# Patient Record
Sex: Male | Born: 1960 | Race: White | Hispanic: No | Marital: Single | Smoking: Never smoker
Health system: Southern US, Community
[De-identification: ages and names within clinical notes are randomized; demographics above are authoritative.]

## PROBLEM LIST (undated history)

## (undated) DIAGNOSIS — F32A Depression, unspecified: Secondary | ICD-10-CM

## (undated) DIAGNOSIS — F329 Major depressive disorder, single episode, unspecified: Secondary | ICD-10-CM

## (undated) DIAGNOSIS — C801 Malignant (primary) neoplasm, unspecified: Secondary | ICD-10-CM

## (undated) HISTORY — PX: LUNG REMOVAL, PARTIAL: SHX233

## (undated) HISTORY — PX: OTHER SURGICAL HISTORY: SHX169

---

## 2015-06-23 ENCOUNTER — Emergency Department (HOSPITAL_BASED_OUTPATIENT_CLINIC_OR_DEPARTMENT_OTHER)
Admission: EM | Admit: 2015-06-23 | Discharge: 2015-06-23 | Disposition: A | Discharge status: Home / Self Care | Payer: Commercial Managed Care - HMO | Attending: Emergency Medicine | Admitting: Emergency Medicine

## 2015-06-23 ENCOUNTER — Emergency Department (HOSPITAL_BASED_OUTPATIENT_CLINIC_OR_DEPARTMENT_OTHER): Payer: Commercial Managed Care - HMO

## 2015-06-23 ENCOUNTER — Encounter (HOSPITAL_BASED_OUTPATIENT_CLINIC_OR_DEPARTMENT_OTHER): Payer: Self-pay

## 2015-06-23 DIAGNOSIS — M545 Low back pain, unspecified: Secondary | ICD-10-CM

## 2015-06-23 DIAGNOSIS — Z79899 Other long term (current) drug therapy: Secondary | ICD-10-CM | POA: Diagnosis not present

## 2015-06-23 DIAGNOSIS — F329 Major depressive disorder, single episode, unspecified: Secondary | ICD-10-CM | POA: Diagnosis not present

## 2015-06-23 DIAGNOSIS — L729 Follicular cyst of the skin and subcutaneous tissue, unspecified: Secondary | ICD-10-CM

## 2015-06-23 DIAGNOSIS — L089 Local infection of the skin and subcutaneous tissue, unspecified: Secondary | ICD-10-CM | POA: Insufficient documentation

## 2015-06-23 DIAGNOSIS — Z859 Personal history of malignant neoplasm, unspecified: Secondary | ICD-10-CM | POA: Insufficient documentation

## 2015-06-23 DIAGNOSIS — Z85118 Personal history of other malignant neoplasm of bronchus and lung: Secondary | ICD-10-CM | POA: Insufficient documentation

## 2015-06-23 HISTORY — DX: Depression, unspecified: F32.A

## 2015-06-23 HISTORY — DX: Malignant (primary) neoplasm, unspecified: C80.1

## 2015-06-23 HISTORY — DX: Major depressive disorder, single episode, unspecified: F32.9

## 2015-06-23 LAB — I-STAT CHEM 8, ED
BUN: 19 mg/dL (ref 6–20)
CALCIUM ION: 1.2 mmol/L (ref 1.12–1.23)
CHLORIDE: 101 mmol/L (ref 101–111)
Creatinine, Ser: 0.7 mg/dL (ref 0.61–1.24)
GLUCOSE: 113 mg/dL — AB (ref 65–99)
HCT: 41 % (ref 39.0–52.0)
HEMOGLOBIN: 13.9 g/dL (ref 13.0–17.0)
POTASSIUM: 4.2 mmol/L (ref 3.5–5.1)
SODIUM: 140 mmol/L (ref 135–145)
TCO2: 26 mmol/L (ref 0–100)

## 2015-06-23 MED ORDER — ONDANSETRON HCL 4 MG/2ML IJ SOLN
4.0000 mg | Freq: Once | INTRAMUSCULAR | Status: AC
  Administered 2015-06-23: 4 mg via INTRAVENOUS
  Filled 2015-06-23: qty 2

## 2015-06-23 MED ORDER — MORPHINE SULFATE 4 MG/ML IJ SOLN
4.0000 mg | Freq: Once | INTRAMUSCULAR | Status: AC
  Administered 2015-06-23: 4 mg via INTRAVENOUS
  Filled 2015-06-23: qty 1

## 2015-06-23 MED ORDER — CLINDAMYCIN HCL 150 MG PO CAPS: 450.0000 mg | ORAL_CAPSULE | Freq: Four times a day (QID) | ORAL | Status: AC

## 2015-06-23 MED ORDER — IOHEXOL 300 MG/ML  SOLN
100.0000 mL | Freq: Once | INTRAMUSCULAR | Status: AC | PRN
  Administered 2015-06-23: 100 mL via INTRAVENOUS

## 2015-06-23 MED ORDER — OXYCODONE HCL 5 MG PO TABS: 5.0000 mg | ORAL_TABLET | ORAL | Status: AC | PRN

## 2015-06-23 NOTE — Discharge Instructions (Signed)
Epidermal Cyst An epidermal cyst is sometimes called a sebaceous cyst, epidermal inclusion cyst, or infundibular cyst. These cysts usually contain a substance that looks "pasty" or "cheesy" and may have a bad smell. This substance is a protein called keratin. Epidermal cysts are usually found on the face, neck, or trunk. They may also occur in the vaginal area or other parts of the genitalia of both men and women. Epidermal cysts are usually small, painless, slow-growing bumps or lumps that move freely under the skin. It is important not to try to pop them. This may cause an infection and lead to tenderness and swelling. CAUSES  Epidermal cysts may be caused by a deep penetrating injury to the skin or a plugged hair follicle, often associated with acne. SYMPTOMS  Epidermal cysts can become inflamed and cause:  Redness.  Tenderness.  Increased temperature of the skin over the bumps or lumps.  Grayish-white, bad smelling material that drains from the bump or lump. DIAGNOSIS  Epidermal cysts are easily diagnosed by your caregiver during an exam. Rarely, a tissue sample (biopsy) may be taken to rule out other conditions that may resemble epidermal cysts. TREATMENT   Epidermal cysts often get better and disappear on their own. They are rarely ever cancerous.  If a cyst becomes infected, it may become inflamed and tender. This may require opening and draining the cyst. Treatment with antibiotics may be necessary. When the infection is gone, the cyst may be removed with minor surgery.  Small, inflamed cysts can often be treated with antibiotics or by injecting steroid medicines.  Sometimes, epidermal cysts become large and bothersome. If this happens, surgical removal in your caregiver's office may be necessary. HOME CARE INSTRUCTIONS  Only take over-the-counter or prescription medicines as directed by your caregiver.  Take your antibiotics as directed. Finish them even if you start to feel  better. SEEK MEDICAL CARE IF:   Your cyst becomes tender, red, or swollen.  Your condition is not improving or is getting worse.  You have any other questions or concerns. MAKE SURE YOU:  Understand these instructions.  Will watch your condition.  Will get help right away if you are not doing well or get worse. Document Released: 10/12/2004 Document Revised: 02/03/2012 Document Reviewed: 05/20/2011 ExitCare Patient Information 2015 [employer]. This information is not intended to replace advice given to you by your health care provider. Make sure you discuss any questions you have with your health care provider.  

## 2015-06-23 NOTE — ED Notes (Signed)
Pt reports L lower lumbar "nodule" that he has had checked before, states that last night he started having intense pain to site, new dx of cancer in neck, had some removed recently and subsequent infection at site.

## 2015-06-23 NOTE — ED Provider Notes (Signed)
CSN: [id number]     Arrival date & time 06/23/15  1130 History   First MD Initiated Contact with Patient 06/23/15 1235     Chief Complaint  Patient presents with  . Cyst     (Consider location/radiation/quality/duration/timing/severity/associated sxs/prior Treatment) Patient is a 54 y.o. male presenting with back pain. The history is provided by the patient.  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 Patient states he's had that therefore many years however is normally not painful. Patient had an ultrasound and was told with a simple cyst. Patient was recently rediagnosed with recurrence of his cancer. Patient is seen at [facility] for this. Took some home oxycodone with some relief. Denies fevers or chills denies nausea vomiting or diarrhea.   Past Medical History  Diagnosis Date  . Cancer   . Carcinoma   . Depression    Past Surgical History  Procedure Laterality Date  . Lung removal, partial    . Neck carcinoma removal     No family history on file. History  Substance Use Topics  . Smoking status: Never Smoker   . Smokeless tobacco: Not on file  . Alcohol Use: No    Review of Systems  Constitutional: Negative for fever and chills.  HENT: Negative for congestion and facial swelling.   Eyes: Negative for discharge and visual disturbance.  Respiratory: Negative for shortness of breath.   Cardiovascular: Negative for chest pain and palpitations.    Gastrointestinal: Negative for vomiting, abdominal pain and diarrhea.  Musculoskeletal: Positive for back pain. Negative for myalgias and arthralgias.  Skin: Negative for color change and rash.  Neurological: Negative for tremors, syncope and headaches.  Psychiatric/Behavioral: Negative for confusion and dysphoric mood.      Allergies  Review of patient's allergies indicates no known allergies.  Home Medications   Prior to Admission medications   Medication Sig Start Date End Date Taking? Authorizing Provider  ARIPiprazole (ABILIFY) 10 MG tablet Take 10 mg by mouth daily.   Yes Historical Provider, MD  Armodafinil (NUVIGIL PO) Take by mouth.   Yes Historical Provider, MD  escitalopram (LEXAPRO) 10 MG tablet Take 10 mg by mouth daily.   Yes Historical Provider, MD  lamoTRIgine (LAMICTAL) 100 MG tablet Take 100 mg by mouth daily.   Yes Historical Provider, MD  oxycodone (OXY-IR) 5 MG capsule Take 5 mg by mouth every 4 (four) hours as needed.   Yes Historical Provider, MD  tamsulosin (FLOMAX) 0.4 MG CAPS capsule Take 0.4 mg by mouth.   Yes Historical Provider, MD  clindamycin (CLEOCIN) 150 MG capsule Take 3 capsules (450 mg total) by mouth every 6 (six) hours. 06/23/15   Deno Etienne, DO  oxyCODONE (ROXICODONE) 5 MG immediate release tablet Take 1 tablet (5 mg total) by mouth every 4 (four) hours as needed for severe pain. 06/23/15   Deno Etienne, DO   BP 113/76 mmHg  Pulse 79  Temp(Src) 97.7 F (36.5  C) (Oral)  Resp 18  Ht 5\' 11"  (1.803 m)  Wt 177 lb (80.287 kg)  BMI 24.70 kg/m2  SpO2 98% Physical Exam  Constitutional: He is oriented to person, place, and time. He appears cachectic.  Hoarse voice  HENT:  Head: Normocephalic and atraumatic.  Eyes: EOM are normal. Pupils are equal, round, and reactive to light.  Neck: Normal range of motion. Neck supple. No JVD present.  Cardiovascular: Normal rate and regular rhythm.  Exam reveals no gallop and no friction rub.   No murmur  heard. Pulmonary/Chest: No respiratory distress. He has no wheezes.  Abdominal: He exhibits no distension. There is no rebound and no guarding.  Musculoskeletal: Normal range of motion.  Neurological: He is alert and oriented to person, place, and time.  Skin: No rash noted. No pallor.     Psychiatric: He has a normal mood and affect. His behavior is normal.    ED Course  Procedures (including critical care time) Labs Review Labs Reviewed  I-STAT CHEM 8, ED - Abnormal; Notable for the following:    Glucose, Bld 113 (*)    All other components within normal limits    Imaging Review Ct Lumbar Spine W Contrast  06/23/2015   CLINICAL DATA:  54 year old male with left lumbar spine pain and palpable nodule. Recent surgery of the neck for carcinoma.  EXAM: CT LUMBAR SPINE WITH CONTRAST  TECHNIQUE: Multidetector CT imaging of the lumbar spine was performed with intravenous contrast administration. Multiplanar CT image reconstructions were also generated.  CONTRAST:  11mL OMNIPAQUE IOHEXOL 300 MG/ML  SOLN  COMPARISON:  CT scan of the neck 03/07/2015 and prior ultrasound of the palpable lower back nodule 12/19/2014  FINDINGS: In the region of the palpable abnormality there is an 11 mm high attenuation soft tissue nodule within the superficial subcutaneous fat approximately 12 mm deep to the skin surface. This is unchanged in size comparing across modalities to the prior ultrasound from December 19, 2014.  There is no evidence of acute fracture or malalignment. The vertebral body heights are maintained. Mild degenerative disc disease with the most significant level of the disc space loss at L4-L5. Minimal facet arthropathy on the left at L4-L5 and L5-S1.  No definite abnormality identified within the visualized portions of the abdomen although there is significant motion related artifact. Trace atherosclerotic calcifications noted in the abdominal aorta.  IMPRESSION: 1. No evidence of acute fracture,  malalignment, osteomyelitis discitis or abscess. 2. Nonspecific 11 mm enhancing soft tissue nodule within the superficial subcutaneous fat 12 mm deep to the skin surface. This is unchanged in size compared to prior ultrasound imaging from January of 2016. The stability over 6 months is suggestive of a benign process. However, given the history of malignancy in the neck, biopsy could be considered. This lesion would be amenable to ultrasound-guided biopsy. 3. Lower lumbar degenerative disc disease and mild left-sided facet arthropathy. 4. Aortic atherosclerosis.   Electronically Signed   By: Jacqulynn Cadet M.D.   On: 06/23/2015 14:15     EKG Interpretation None      MDM   Final diagnoses:  Low back pain  Infected cyst of skin    54 yo M with chief complaint of a tender nodule in his back. This is fairly deep to the skin no noted superficial changes. Markedly tender to palpation. Concern for possible metastasis. We'll obtain a CT scan.   CT scan without definitive signs of metastasis. Metabolic panel unremarkable. Likely infected cyst. Will  treat with clindamycin have follow-up with his oncologist if persists for possible need for drainage   I have discussed the diagnosis/risks/treatment options with the patient and family and believe the pt to be eligible for discharge home to follow-up with Oncology. We also discussed returning to the ED immediately if new or worsening sx occur. We discussed the sx which are most concerning (e.g., sudden worsening pain, fever) that necessitate immediate return. Medications administered to the patient during their visit and any new prescriptions provided to the patient are listed below.  Medications given during this visit Medications  morphine 4 MG/ML injection 4 mg (4 mg Intravenous Given 06/23/15 1316)  ondansetron (ZOFRAN) injection 4 mg (4 mg Intravenous Given 06/23/15 1316)  iohexol (OMNIPAQUE) 300 MG/ML solution 100 mL (100 mLs Intravenous Contrast  Given 06/23/15 1335)    Discharge Medication List as of 06/23/2015  2:21 PM    START taking these medications   Details  clindamycin (CLEOCIN) 150 MG capsule Take 3 capsules (450 mg total) by mouth every 6 (six) hours., Starting 06/23/2015, Until Discontinued, Print    oxyCODONE (ROXICODONE) 5 MG immediate release tablet Take 1 tablet (5 mg total) by mouth every 4 (four) hours as needed for severe pain., Starting 06/23/2015, Until Discontinued, Print         The patient appears reasonably screen and/or stabilized for discharge and I doubt any other medical condition or other [facility] requiring further screening, evaluation, or treatment in the ED at this time prior to discharge.    Deno Etienne, DO 06/23/15 2121

## 2015-08-17 ENCOUNTER — Encounter (HOSPITAL_BASED_OUTPATIENT_CLINIC_OR_DEPARTMENT_OTHER): Payer: Self-pay | Admitting: Emergency Medicine

## 2015-08-17 ENCOUNTER — Emergency Department (HOSPITAL_BASED_OUTPATIENT_CLINIC_OR_DEPARTMENT_OTHER)
Admission: EM | Admit: 2015-08-17 | Discharge: 2015-08-17 | Disposition: A | Discharge status: Home / Self Care | Payer: Commercial Managed Care - HMO | Attending: Emergency Medicine | Admitting: Emergency Medicine

## 2015-08-17 DIAGNOSIS — R140 Abdominal distension (gaseous): Secondary | ICD-10-CM | POA: Insufficient documentation

## 2015-08-17 DIAGNOSIS — R339 Retention of urine, unspecified: Secondary | ICD-10-CM | POA: Diagnosis present

## 2015-08-17 DIAGNOSIS — F329 Major depressive disorder, single episode, unspecified: Secondary | ICD-10-CM | POA: Diagnosis not present

## 2015-08-17 DIAGNOSIS — Z79899 Other long term (current) drug therapy: Secondary | ICD-10-CM | POA: Diagnosis not present

## 2015-08-17 DIAGNOSIS — Z859 Personal history of malignant neoplasm, unspecified: Secondary | ICD-10-CM | POA: Insufficient documentation

## 2015-08-17 DIAGNOSIS — R1030 Lower abdominal pain, unspecified: Secondary | ICD-10-CM | POA: Diagnosis not present

## 2015-08-17 LAB — URINALYSIS, ROUTINE W REFLEX MICROSCOPIC
Bilirubin Urine: NEGATIVE
Glucose, UA: NEGATIVE mg/dL
Hgb urine dipstick: NEGATIVE
Ketones, ur: NEGATIVE mg/dL
LEUKOCYTES UA: NEGATIVE
NITRITE: NEGATIVE
PH: 6 (ref 5.0–8.0)
Protein, ur: NEGATIVE mg/dL
SPECIFIC GRAVITY, URINE: 1.012 (ref 1.005–1.030)
Urobilinogen, UA: 1 mg/dL (ref 0.0–1.0)

## 2015-08-17 NOTE — ED Provider Notes (Signed)
CSN: [id number]     Arrival date & time 08/17/15  2045 History  This chart was scribed for Henry Speak, MD by Julien Nordmann, ED Scribe. This patient was seen in room MH05/MH05 and the patient's care was started at 8:52 PM.    Chief Complaint  Patient presents with  . Urinary Retention     The history is provided by the patient. No language interpreter was used.   HPI Comments: Henry Mcknight is a 54 y.o. male who presents to the Emergency Department complaining of sudden onset gradual worsening urinary intention onset this morning. Pt reports having a bronchoscope procedure done today and was put under anesthesia and stated he has not been able to urinate since. He notes that this happens every time he is put under anesthesia. He denies shortness of breath.  Past Medical History  Diagnosis Date  . Cancer   . Carcinoma   . Depression    Past Surgical History  Procedure Laterality Date  . Lung removal, partial    . Neck carcinoma removal     No family history on file. Social History  Substance Use Topics  . Smoking status: Never Smoker   . Smokeless tobacco: None  . Alcohol Use: No    Review of Systems  A complete 10 system review of systems was obtained and all systems are negative except as noted in the HPI and PMH.    Allergies  Review of patient's allergies indicates no known allergies.  Home Medications   Prior to Admission medications   Medication Sig Start Date End Date Taking? Authorizing Raylyn Carton  ARIPiprazole (ABILIFY) 10 MG tablet Take 10 mg by mouth daily.    Historical Russ Looper, MD  Armodafinil (NUVIGIL PO) Take by mouth.    Historical Sedonia Kitner, MD  clindamycin (CLEOCIN) 150 MG capsule Take 3 capsules (450 mg total) by mouth every 6 (six) hours. 06/23/15   Deno Etienne, DO  escitalopram (LEXAPRO) 10 MG tablet Take 10 mg by mouth daily.    Historical Telly Jawad, MD  lamoTRIgine (LAMICTAL) 100 MG tablet Take 100 mg by mouth daily.    Historical Grissel Tyrell, MD   oxycodone (OXY-IR) 5 MG capsule Take 5 mg by mouth every 4 (four) hours as needed.    Historical Erskine Steinfeldt, MD  oxyCODONE (ROXICODONE) 5 MG immediate release tablet Take 1 tablet (5 mg total) by mouth every 4 (four) hours as needed for severe pain. 06/23/15   Deno Etienne, DO  tamsulosin (FLOMAX) 0.4 MG CAPS capsule Take 0.4 mg by mouth.    Historical Oneda Duffett, MD   Triage vitals: BP 141/88 mmHg  Pulse 94  Temp(Src) 97.7 F (36.5 C) (Oral)  Resp 22  Ht 5' 11.5" (1.816 m)  Wt 171 lb (77.565 kg)  BMI 23.52 kg/m2  SpO2 98% Physical Exam  Constitutional: He is oriented to person, place, and time. He appears well-developed and well-nourished.  HENT:  Head: Normocephalic.  Eyes: EOM are normal.  Neck: Normal range of motion.  Pulmonary/Chest: Effort normal.  Abdominal: He exhibits distension.  TTP in suprapubic region. Urinary bladder appears distended to palpation  Musculoskeletal: Normal range of motion.  Neurological: He is alert and oriented to person, place, and time.  Psychiatric: He has a normal mood and affect.  Nursing note and vitals reviewed.   ED Course  Procedures  DIAGNOSTIC STUDIES: Oxygen Saturation is 98% on RA, normal by my interpretation.  COORDINATION OF CARE:  8:56 PM Discussed treatment plan which includes catheter with pt at  bedside and pt agreed to plan.  Labs Review Labs Reviewed - No data to display  Imaging Review No results found. I have personally reviewed and evaluated these images and lab results as part of my medical decision-making.   EKG Interpretation None      MDM   Final diagnoses:  None    Foley catheter placed with 800 mL of urine obtained. There is no evidence for infection. He will be discharged to home, to return as needed for any problems.  I personally performed the services described in this documentation, which was scribed in my presence. The recorded information has been reviewed and is accurate.      Henry Speak,  MD 08/17/15 2215

## 2015-08-17 NOTE — Discharge Instructions (Signed)
Acute Urinary Retention °Acute urinary retention is the temporary inability to urinate. °This is a common problem in older men. As men age their prostates become larger and block the flow of urine from the bladder. This is usually a problem that has come on gradually.  °HOME CARE INSTRUCTIONS °If you are sent home with a Foley catheter and a drainage system, you will need to discuss the best course of action with your health care provider. While the catheter is in, maintain a good intake of fluids. Keep the drainage bag emptied and lower than your catheter. This is so that contaminated urine will not flow back into your bladder, which could lead to a urinary tract infection. °There are two main types of drainage bags. One is a large bag that usually is used at night. It has a good capacity that will allow you to sleep through the night without having to empty it. The second type is called a leg bag. It has a smaller capacity, so it needs to be emptied more frequently. However, the main advantage is that it can be attached by a leg strap and can go underneath your clothing, allowing you the freedom to move about or leave your home. °Only take over-the-counter or prescription medicines for pain, discomfort, or fever as directed by your health care provider.  °SEEK MEDICAL CARE IF: °· You develop a low-grade fever. °· You experience spasms or leakage of urine with the spasms. °SEEK IMMEDIATE MEDICAL CARE IF:  °· You develop chills or fever. °· Your catheter stops draining urine. °· Your catheter falls out. °· You start to develop increased bleeding that does not respond to rest and increased fluid intake. °MAKE SURE YOU: °· Understand these instructions. °· Will watch your condition. °· Will get help right away if you are not doing well or get worse. °Document Released: 02/17/2001 Document Revised: 11/16/2013 Document Reviewed: 04/22/2013 °Exit[employer]® Patient Information ©2015 [employer]. This information is not  intended to replace advice given to you by your health care provider. Make sure you discuss any questions you have with your health care provider. ° °

## 2015-08-17 NOTE — ED Notes (Signed)
Pt hasn't been able to urinate after having a bronchoscope procedure today. Pt states happens every time he gets anesthesia

## 2015-08-18 ENCOUNTER — Emergency Department (HOSPITAL_BASED_OUTPATIENT_CLINIC_OR_DEPARTMENT_OTHER)
Admission: EM | Admit: 2015-08-18 | Discharge: 2015-08-18 | Disposition: A | Discharge status: Home / Self Care | Payer: Commercial Managed Care - HMO | Attending: Emergency Medicine | Admitting: Emergency Medicine

## 2015-08-18 ENCOUNTER — Encounter (HOSPITAL_BASED_OUTPATIENT_CLINIC_OR_DEPARTMENT_OTHER): Payer: Self-pay | Admitting: *Deleted

## 2015-08-18 DIAGNOSIS — Z79899 Other long term (current) drug therapy: Secondary | ICD-10-CM | POA: Diagnosis not present

## 2015-08-18 DIAGNOSIS — Z859 Personal history of malignant neoplasm, unspecified: Secondary | ICD-10-CM | POA: Insufficient documentation

## 2015-08-18 DIAGNOSIS — Z792 Long term (current) use of antibiotics: Secondary | ICD-10-CM | POA: Insufficient documentation

## 2015-08-18 DIAGNOSIS — R339 Retention of urine, unspecified: Secondary | ICD-10-CM | POA: Diagnosis present

## 2015-08-18 DIAGNOSIS — F329 Major depressive disorder, single episode, unspecified: Secondary | ICD-10-CM | POA: Diagnosis not present

## 2015-08-18 NOTE — ED Provider Notes (Signed)
CSN: [id number]     Arrival date & time 08/18/15  1323 History   First MD Initiated Contact with Patient 08/18/15 1334     Chief Complaint  Patient presents with  . Urinary Retention      HPI Patient returns with urinary retention.  Had in out catheter yesterday with 800 cc.  Felt better but now cannot urinate.  Patient has history of benign prostatic hypertrophy and takes Flomax.  Has a urologist. Past Medical History  Diagnosis Date  . Cancer   . Carcinoma   . Depression    Past Surgical History  Procedure Laterality Date  . Lung removal, partial    . Neck carcinoma removal     No family history on file. Social History  Substance Use Topics  . Smoking status: Never Smoker   . Smokeless tobacco: None  . Alcohol Use: No    Review of Systems  All other systems reviewed and are negative  Allergies  Review of patient's allergies indicates no known allergies.  Home Medications   Prior to Admission medications   Medication Sig Start Date End Date Taking? Authorizing Provider  ARIPiprazole (ABILIFY) 10 MG tablet Take 10 mg by mouth daily.    Historical Provider, MD  Armodafinil (NUVIGIL PO) Take by mouth.    Historical Provider, MD  clindamycin (CLEOCIN) 150 MG capsule Take 3 capsules (450 mg total) by mouth every 6 (six) hours. 06/23/15   Deno Etienne, DO  escitalopram (LEXAPRO) 10 MG tablet Take 10 mg by mouth daily.    Historical Provider, MD  lamoTRIgine (LAMICTAL) 100 MG tablet Take 100 mg by mouth daily.    Historical Provider, MD  oxycodone (OXY-IR) 5 MG capsule Take 5 mg by mouth every 4 (four) hours as needed.    Historical Provider, MD  oxyCODONE (ROXICODONE) 5 MG immediate release tablet Take 1 tablet (5 mg total) by mouth every 4 (four) hours as needed for severe pain. 06/23/15   Deno Etienne, DO  tamsulosin (FLOMAX) 0.4 MG CAPS capsule Take 0.4 mg by mouth.    Historical Provider, MD   BP 139/96 mmHg  Pulse 93  Temp(Src) 98.1 F (36.7 C) (Oral)  Resp 18  Ht 5'  11" (1.803 m)  Wt 171 lb (77.565 kg)  BMI 23.86 kg/m2  SpO2 98% Physical Exam Physical Exam  Nursing note and vitals reviewed. Constitutional: He is oriented to person, place, and time. He appears well-developed and well-nourished. No distress.  HENT:  Head: Normocephalic and atraumatic.  Eyes: Pupils are equal, round, and reactive to light.  Neck: Normal range of motion.  Cardiovascular: Normal rate and intact distal pulses.   Pulmonary/Chest: No respiratory distress.  Abdominal: Normal appearance. He exhibits no distension.  Tenderness in pain over the suprapubic area.   Musculoskeletal: Normal range of motion.  Neurological: He is alert and oriented to person, place, and time. No cranial nerve deficit.  Skin: Skin is warm and dry. No rash noted.  Psychiatric: He has a normal mood and affect. His behavior is normal.   ED Course  Procedures (including critical care time) Foley catheter was placed in approximate 700 cc urine returned.  Leg bag placed. Labs Review Labs Reviewed - No data to display  Imaging Review No results found. I have personally reviewed and evaluated these images and lab results as part of my medical decision-making.  After treatment in the ED the patient feels back to baseline and wants to go home.  MDM   Final  diagnoses:  Urinary retention        Leonard Schwartz, MD 08/18/15 1420

## 2015-08-18 NOTE — Discharge Instructions (Signed)
Acute Urinary Retention °Acute urinary retention is the temporary inability to urinate. °This is a common problem in older men. As men age their prostates become larger and block the flow of urine from the bladder. This is usually a problem that has come on gradually.  °HOME CARE INSTRUCTIONS °If you are sent home with a Foley catheter and a drainage system, you will need to discuss the best course of action with your health care provider. While the catheter is in, maintain a good intake of fluids. Keep the drainage bag emptied and lower than your catheter. This is so that contaminated urine will not flow back into your bladder, which could lead to a urinary tract infection. °There are two main types of drainage bags. One is a large bag that usually is used at night. It has a good capacity that will allow you to sleep through the night without having to empty it. The second type is called a leg bag. It has a smaller capacity, so it needs to be emptied more frequently. However, the main advantage is that it can be attached by a leg strap and can go underneath your clothing, allowing you the freedom to move about or leave your home. °Only take over-the-counter or prescription medicines for pain, discomfort, or fever as directed by your health care provider.  °SEEK MEDICAL CARE IF: °· You develop a low-grade fever. °· You experience spasms or leakage of urine with the spasms. °SEEK IMMEDIATE MEDICAL CARE IF:  °· You develop chills or fever. °· Your catheter stops draining urine. °· Your catheter falls out. °· You start to develop increased bleeding that does not respond to rest and increased fluid intake. °MAKE SURE YOU: °· Understand these instructions. °· Will watch your condition. °· Will get help right away if you are not doing well or get worse. °Document Released: 02/17/2001 Document Revised: 11/16/2013 Document Reviewed: 04/22/2013 °Exit[employer]® Patient Information ©2015 [employer]. This information is not  intended to replace advice given to you by your health care provider. Make sure you discuss any questions you have with your health care provider. ° °

## 2015-08-18 NOTE — ED Notes (Signed)
Urinary retention. Was here yesterday for same and had an I&O cath.

## 2015-09-03 IMAGING — CT CT L SPINE W/ CM
3 series · 14 of 33 positions shown, 17 images · IV contrast (APPLIED)
Comparison: CT scan of the neck 03/07/2015 and prior ultrasound of
the palpable lower back nodule 12/19/2014

CLINICAL DATA: 53-year-old male with left lumbar spine pain and
palpable nodule. Recent surgery of the neck for carcinoma.

EXAM:
CT LUMBAR SPINE WITH CONTRAST
TECHNIQUE: Multidetector CT imaging of the lumbar spine was performed with
intravenous contrast administration. Multiplanar CT image
reconstructions were also generated.
CONTRAST:  100mL OMNIPAQUE IOHEXOL 300 MG/ML  SOLN

[Series 3: spine 2.0 b31s st · axial · 0.45mm/px · z∈[-262,-62]mm · 6 of 130 slices shown, 8 images]
[im 20/130  soft-tissue]
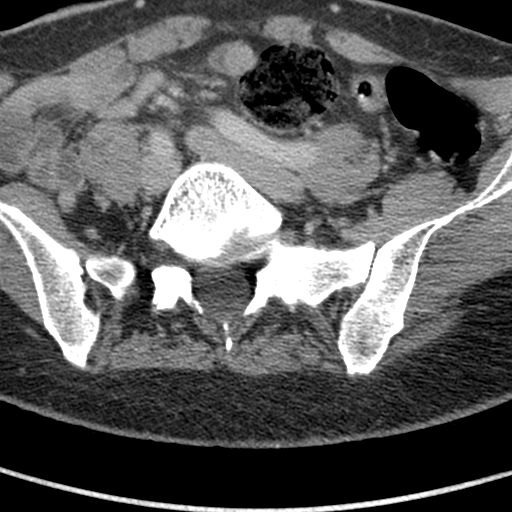
[im 20/130  bone]
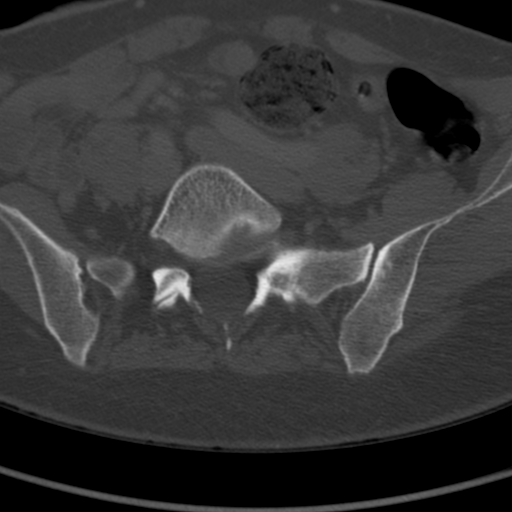
[im 40/130  bone]
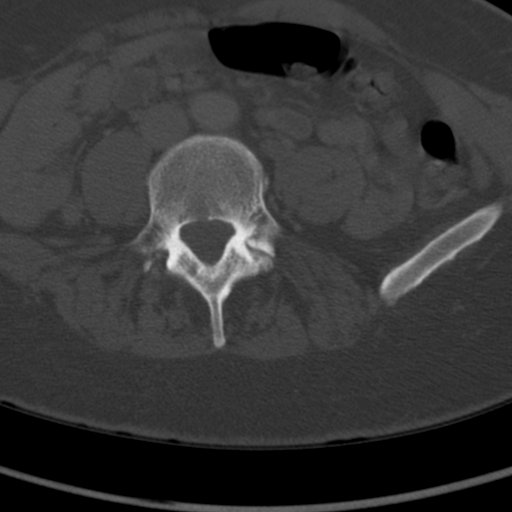
[im 60/130  bone]
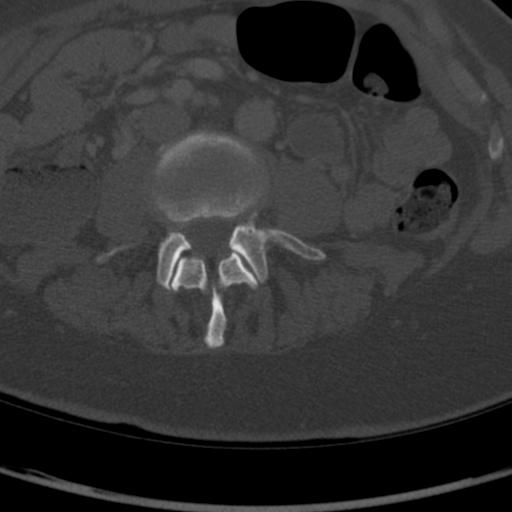
[im 80/130  bone]
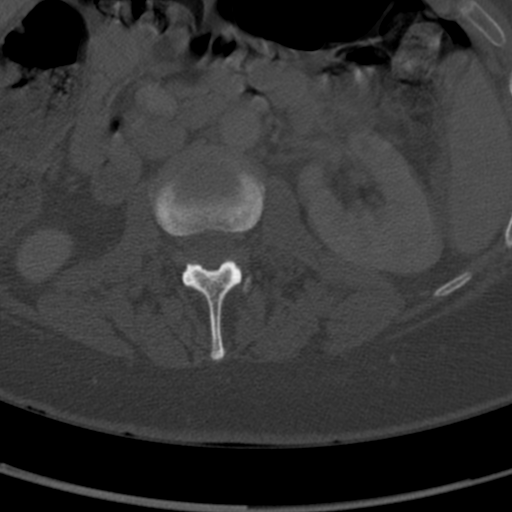
[im 100/130  soft-tissue]
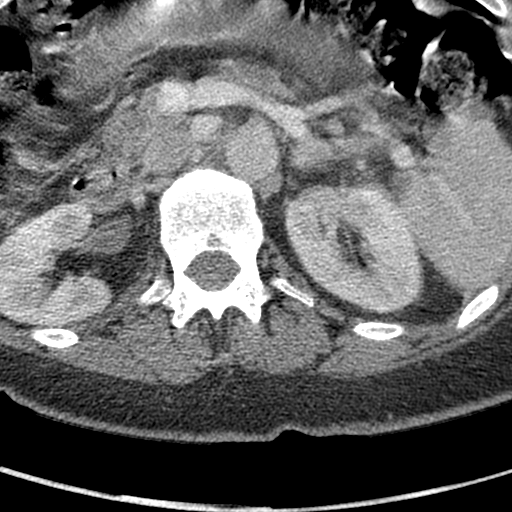
[im 100/130  bone]
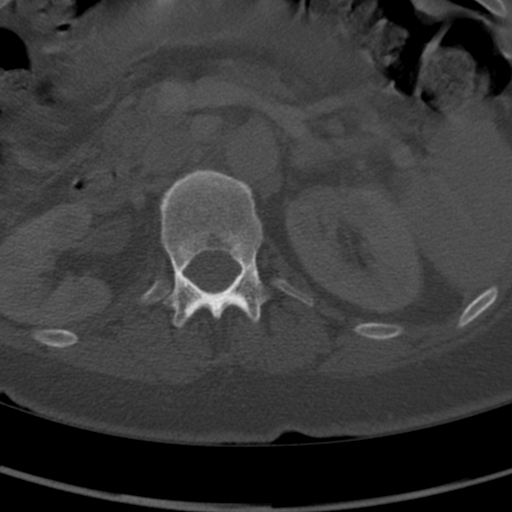
[im 120/130  bone]
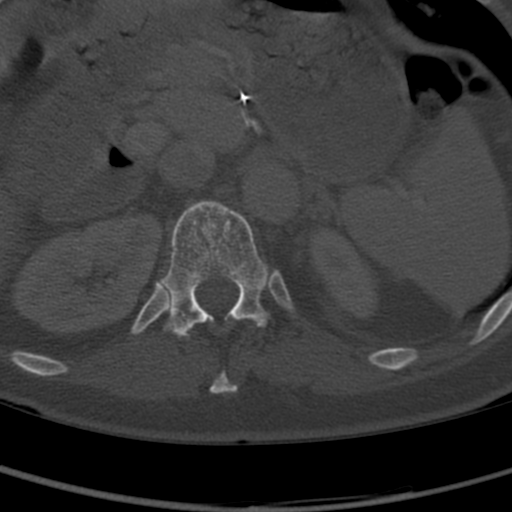

[Series 6: spine 2.0 coronal st · coronal · 0.50mm/px · 3 of 70 slices shown]
[im 14/70  bone]
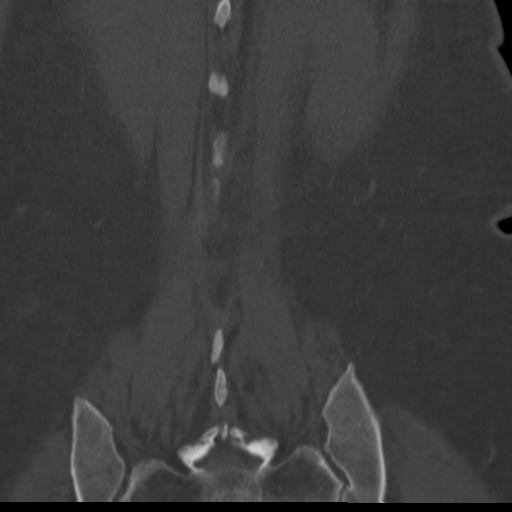
[im 28/70  bone]
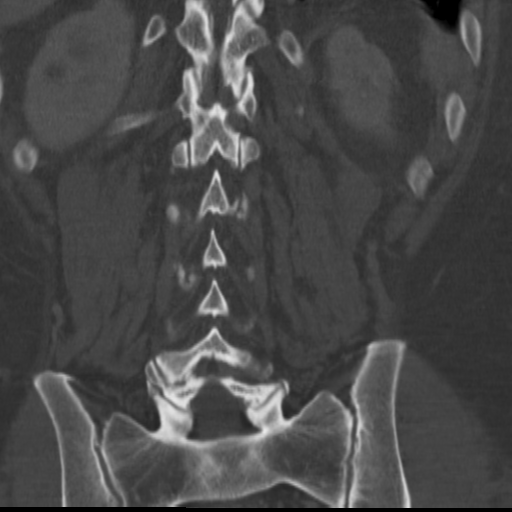
[im 42/70  bone]
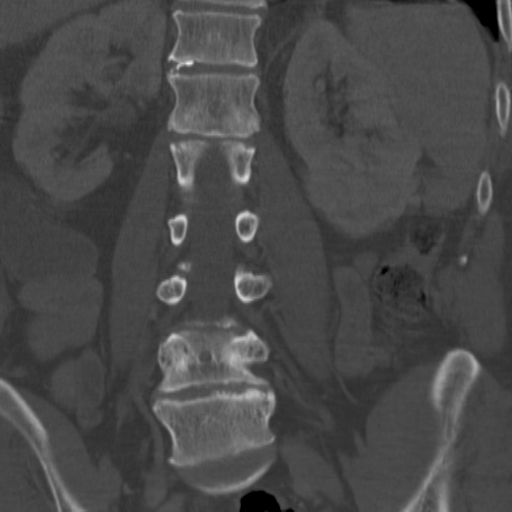

[Series 7: spine 2.0 sagittal st · sagittal · 0.38mm/px · 5 of 81 slices shown, 6 images]
[im 27/81  bone]
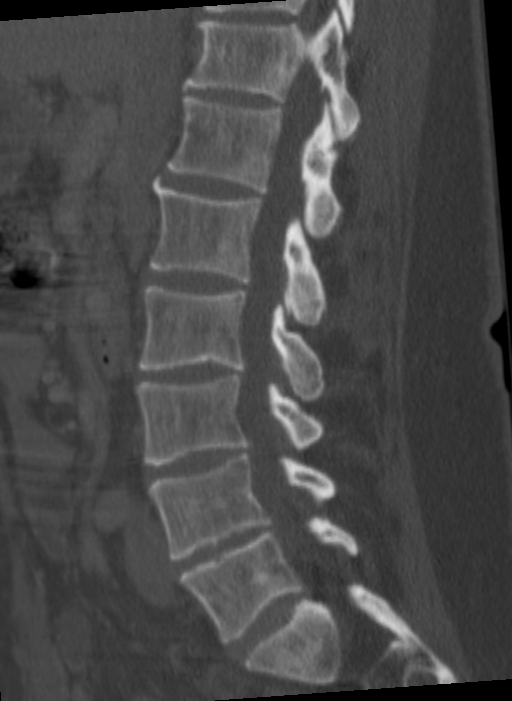
[im 34/81  bone]
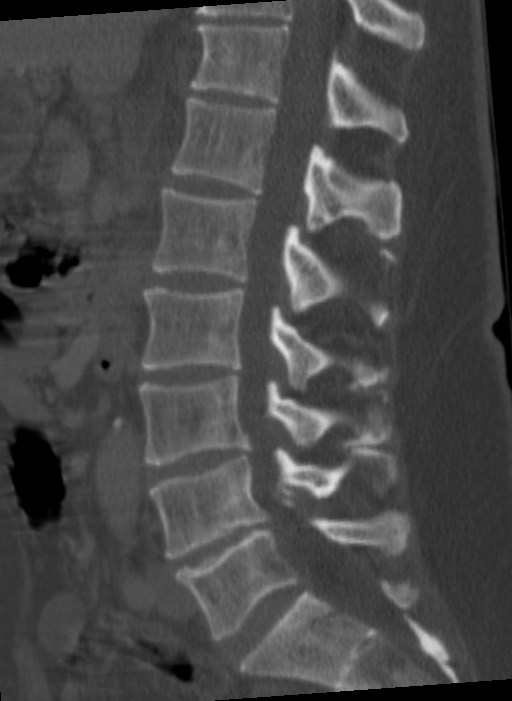
[im 41/81  soft-tissue]
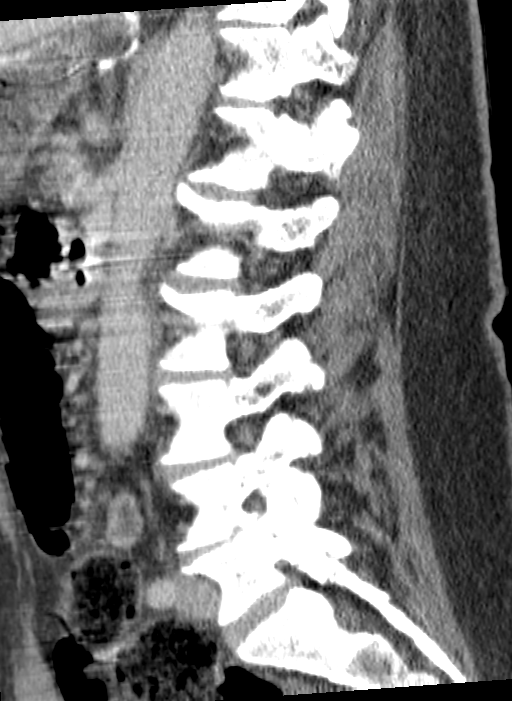
[im 41/81  bone]
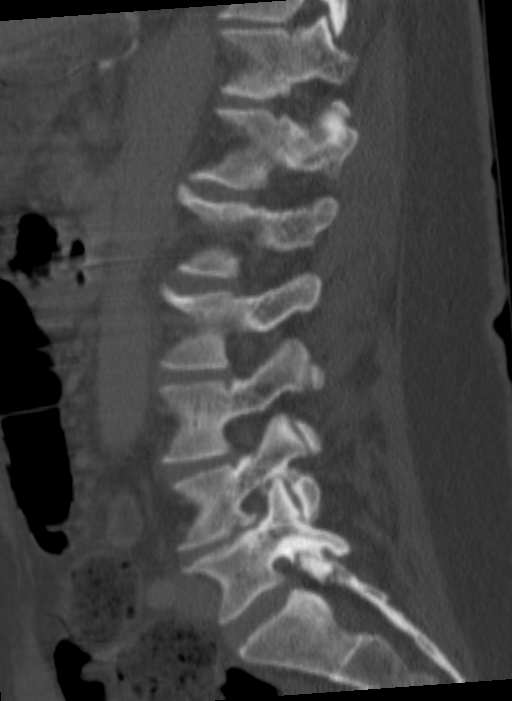
[im 47/81  bone]
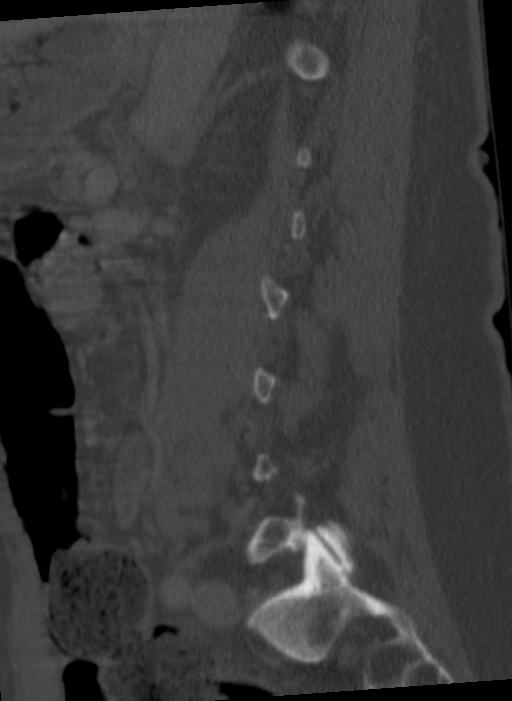
[im 54/81  bone]
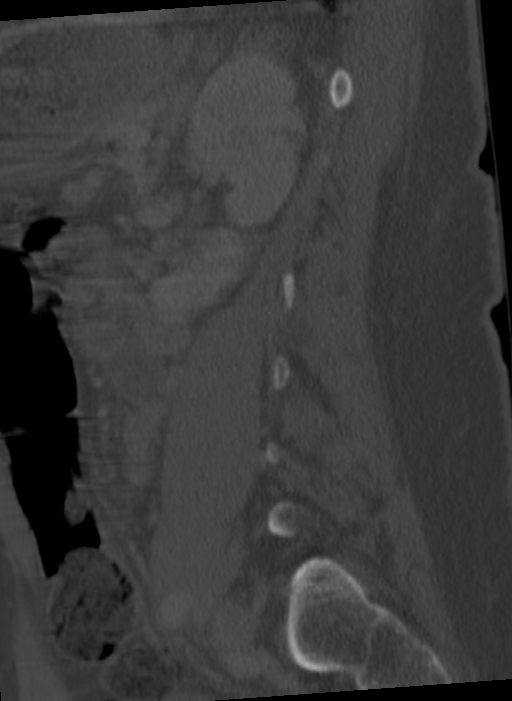

[14 of 33 positions shown; findings below may reference images not displayed]

FINDINGS: In the region of the palpable abnormality there is an 11 mm high
attenuation soft tissue nodule within the superficial subcutaneous
fat approximately 12 mm deep to the skin surface. This is unchanged
in size comparing across modalities to the prior ultrasound from
December 19, 2014.

There is no evidence of acute fracture or malalignment. The
vertebral body heights are maintained. Mild degenerative disc
disease with the most significant level of the disc space loss at
L4-L5. Minimal facet arthropathy on the left at L4-L5 and L5-S1.

No definite abnormality identified within the visualized portions of
the abdomen although there is significant motion related artifact.
Trace atherosclerotic calcifications noted in the abdominal aorta.
IMPRESSION: 1. No evidence of acute fracture, malalignment, osteomyelitis
discitis or abscess.
2. Nonspecific 11 mm enhancing soft tissue nodule within the
superficial subcutaneous fat 12 mm deep to the skin surface. This is
unchanged in size compared to prior ultrasound imaging from Tuesday November, 2014. The stability over 6 months is suggestive of a benign
process. However, given the history of malignancy in the neck,
biopsy could be considered. This lesion would be amenable to
ultrasound-guided biopsy.
3. Lower lumbar degenerative disc disease and mild left-sided facet
arthropathy.
4. Aortic atherosclerosis.

## 2018-01-23 DEATH — deceased
